# Patient Record
Sex: Male | Born: 1950 | Race: White | Hispanic: No | Marital: Single | Smoking: Never smoker
Health system: Southern US, Community
[De-identification: ages and names within clinical notes are randomized; demographics above are authoritative.]

## PROBLEM LIST (undated history)

## (undated) ENCOUNTER — Emergency Department (HOSPITAL_BASED_OUTPATIENT_CLINIC_OR_DEPARTMENT_OTHER): Payer: Medicare HMO

## (undated) DIAGNOSIS — F32A Depression, unspecified: Secondary | ICD-10-CM

## (undated) DIAGNOSIS — K219 Gastro-esophageal reflux disease without esophagitis: Secondary | ICD-10-CM

## (undated) DIAGNOSIS — E039 Hypothyroidism, unspecified: Secondary | ICD-10-CM

## (undated) DIAGNOSIS — I10 Essential (primary) hypertension: Secondary | ICD-10-CM

## (undated) HISTORY — PX: URETHRA SURGERY: SHX824

## (undated) HISTORY — PX: EXTERNAL EAR SURGERY: SHX627

---

## 2021-06-15 ENCOUNTER — Emergency Department (HOSPITAL_COMMUNITY): Payer: Medicare HMO

## 2021-06-15 ENCOUNTER — Encounter (HOSPITAL_COMMUNITY): Payer: Self-pay | Admitting: Emergency Medicine

## 2021-06-15 ENCOUNTER — Emergency Department (HOSPITAL_COMMUNITY)
Admission: EM | Admit: 2021-06-15 | Discharge: 2021-06-16 | Disposition: A | Discharge status: Home / Self Care | Payer: Medicare HMO | Source: Home / Self Care | Attending: Emergency Medicine | Admitting: Emergency Medicine

## 2021-06-15 ENCOUNTER — Other Ambulatory Visit: Payer: Self-pay

## 2021-06-15 DIAGNOSIS — Z23 Encounter for immunization: Secondary | ICD-10-CM | POA: Insufficient documentation

## 2021-06-15 DIAGNOSIS — S62635A Displaced fracture of distal phalanx of left ring finger, initial encounter for closed fracture: Secondary | ICD-10-CM | POA: Diagnosis not present

## 2021-06-15 DIAGNOSIS — S68625A Partial traumatic transphalangeal amputation of left ring finger, initial encounter: Secondary | ICD-10-CM

## 2021-06-15 DIAGNOSIS — Y9389 Activity, other specified: Secondary | ICD-10-CM | POA: Insufficient documentation

## 2021-06-15 DIAGNOSIS — Z79890 Hormone replacement therapy (postmenopausal): Secondary | ICD-10-CM | POA: Diagnosis not present

## 2021-06-15 DIAGNOSIS — W231XXA Caught, crushed, jammed, or pinched between stationary objects, initial encounter: Secondary | ICD-10-CM | POA: Insufficient documentation

## 2021-06-15 DIAGNOSIS — Z888 Allergy status to other drugs, medicaments and biological substances status: Secondary | ICD-10-CM | POA: Diagnosis not present

## 2021-06-15 DIAGNOSIS — W230XXA Caught, crushed, jammed, or pinched between moving objects, initial encounter: Secondary | ICD-10-CM | POA: Diagnosis not present

## 2021-06-15 DIAGNOSIS — Z79899 Other long term (current) drug therapy: Secondary | ICD-10-CM | POA: Diagnosis not present

## 2021-06-15 DIAGNOSIS — Z20822 Contact with and (suspected) exposure to covid-19: Secondary | ICD-10-CM | POA: Insufficient documentation

## 2021-06-15 DIAGNOSIS — I10 Essential (primary) hypertension: Secondary | ICD-10-CM | POA: Insufficient documentation

## 2021-06-15 HISTORY — DX: Essential (primary) hypertension: I10

## 2021-06-15 LAB — RESP PANEL BY RT-PCR (FLU A&B, COVID) ARPGX2
Influenza A by PCR: NEGATIVE
Influenza B by PCR: NEGATIVE
SARS Coronavirus 2 by RT PCR: NEGATIVE

## 2021-06-15 MED ORDER — CEFAZOLIN SODIUM-DEXTROSE 1-4 GM/50ML-% IV SOLN
1.0000 g | Freq: Once | INTRAVENOUS | Status: AC
  Administered 2021-06-16: 1 g via INTRAVENOUS
  Filled 2021-06-15: qty 50

## 2021-06-15 MED ORDER — TETANUS-DIPHTH-ACELL PERTUSSIS 5-2.5-18.5 LF-MCG/0.5 IM SUSY
0.5000 mL | PREFILLED_SYRINGE | Freq: Once | INTRAMUSCULAR | Status: AC
  Administered 2021-06-16: 0.5 mL via INTRAMUSCULAR
  Filled 2021-06-15: qty 0.5

## 2021-06-15 NOTE — ED Provider Notes (Signed)
[facility] EMERGENCY DEPARTMENT Provider Note  CSN: [id number] Arrival date & time: 06/15/21 1524  Chief Complaint(s) Finger Injury  HPI Thomas Hartman is a 70 y.o. male who sustained a crush injury to the left ring finger after getting it caught in a dry belt machinery.  This occurred around 1 PM.  Patient denies any severe pain at this time, but does have some mild discomfort.  Worse with palpation.  Alleviated by mobility. reports that he did not irrigate it.  He wrapped it up and came in for evaluation.  Unsure of tetanus update.  Denies any other injuries related to the incident.  Denies any other physical complaints.  HPI  Past Medical History Past Medical History:  Diagnosis Date   Hypertension    There are no problems to display for this patient.  Home Medication(s) Prior to Admission medications   Medication Sig Start Date End Date Taking? Authorizing Provider  allopurinol (ZYLOPRIM) 300 MG tablet Take 300 mg by mouth daily. 06/11/21  Yes [provider]  amLODipine (NORVASC) 5 MG tablet Take 5 mg by mouth daily. 06/11/21  Yes [provider]  atorvastatin (LIPITOR) 10 MG tablet Take 10 mg by mouth daily. 06/11/21  Yes [provider]  Cholecalciferol (VITAMIN D3 PO) Take 1 tablet by mouth daily.   Yes [provider]  Cyanocobalamin (VITAMIN B-12 PO) Take 1 tablet by mouth daily.   Yes [provider]  diphenhydrAMINE (SOMINEX) 25 MG tablet Take 25 mg by mouth daily as needed for allergies or itching.   Yes [provider]  FLUoxetine (PROZAC) 40 MG capsule Take 40 mg by mouth daily. 03/04/21  Yes [provider]  fluticasone (FLONASE) 50 MCG/ACT nasal spray Place 2 sprays into both nostrils daily. 03/22/16  Yes [provider]  ibuprofen (ADVIL) 200 MG tablet Take 400-800 mg by mouth every 6 (six) hours as needed for headache.   Yes [provider]  levothyroxine (SYNTHROID) 50 MCG  tablet Take 50 mcg by mouth daily before breakfast. 09/25/20  Yes [provider]  loratadine (CLARITIN) 10 MG tablet Take 10 mg by mouth daily.   Yes [provider]  Melatonin 10 MG TABS Take 10 mg by mouth at bedtime.   Yes [provider]  metoprolol succinate (TOPROL-XL) 50 MG 24 hr tablet Take 25 mg by mouth daily. 05/16/21  Yes [provider]  Multiple Vitamin (MULTIVITAMIN) tablet Take 1 tablet by mouth daily.   Yes [provider]  naproxen sodium (ALEVE) 220 MG tablet Take 220 mg by mouth daily as needed (pain).   Yes [provider]  omeprazole (PRILOSEC) 20 MG capsule Take 20 mg by mouth daily. 03/06/21  Yes [provider]  Past Surgical History Past Surgical History:  Procedure Laterality Date   EXTERNAL EAR SURGERY     URETHRA SURGERY     Family History No family history on file.  Social History Social History   Tobacco Use   Smoking status: Never   Smokeless tobacco: Never  Substance Use Topics   Alcohol use: Never   Drug use: Never   Allergies Zolpidem  Review of Systems Review of Systems All other systems are reviewed and are negative for acute change except as noted in the HPI  Physical Exam Vital Signs  I have reviewed the triage vital signs BP (!) 135/93 (BP Location: Right Arm)   Pulse (!) 54   Temp 98.4 F (36.9 C) (Oral)   Resp 16   Ht 5\' 10"  (1.778 m)   Wt 81.6 kg   SpO2 97%   BMI 25.83 kg/m   Physical Exam Vitals reviewed.  Constitutional:      General: He is not in acute distress.    Appearance: He is well-developed. He is not diaphoretic.  HENT:     Head: Normocephalic and atraumatic.     Right Ear: External ear normal.     Left Ear: External ear normal.     Nose: Nose normal.     Mouth/Throat:     Mouth: Mucous membranes are moist.  Eyes:      General: No scleral icterus.    Conjunctiva/sclera: Conjunctivae normal.  Neck:     Trachea: Phonation normal.  Cardiovascular:     Rate and Rhythm: Normal rate and regular rhythm.  Pulmonary:     Effort: Pulmonary effort is normal. No respiratory distress.     Breath sounds: No stridor.  Abdominal:     General: There is no distension.  Musculoskeletal:        General: Normal range of motion.       Hands:     Cervical back: Normal range of motion.  Neurological:     Mental Status: He is alert and oriented to person, place, and time.  Psychiatric:        Behavior: Behavior normal.         ED Results and Treatments Labs (all labs ordered are listed, but only abnormal results are displayed) Labs Reviewed  CBC WITH DIFFERENTIAL/PLATELET - Abnormal; Notable for the following components:      Result Value   Platelets 145 (*)    All other components within normal limits  BASIC METABOLIC PANEL - Abnormal; Notable for the following components:   Glucose, Bld 112 (*)    BUN 27 (*)    Creatinine, Ser 1.35 (*)    GFR, Estimated 56 (*)    All other components within normal limits  RESP PANEL BY RT-PCR (FLU A&B, COVID) ARPGX2                                                                                                                         EKG  EKG Interpretation  Date/Time:    Ventricular Rate:    PR Interval:    QRS Duration:   QT Interval:    QTC Calculation:   R Axis:     Text Interpretation:          Radiology DG Finger Ring Left  Result Date: 06/15/2021 CLINICAL DATA:  Laceration, digit was caught in moving machinery. EXAM: LEFT RING FINGER 2+V COMPARISON:  None. FINDINGS: Displaced fracture through the distal tuft. There is also a fracture involving the ulnar aspect at the base of the distal phalanx at the distal interphalangeal joint articular surface. Skin laceration and irregularity. Overlying dressing in place without definite radiopaque foreign body.  IMPRESSION: 1. Displaced distal phalanx fracture. Additionally there is a fracture of the ulnar aspect at the base of the distal phalanx at the distal interphalangeal joint articular surface. 2. Overlying soft tissue irregularity and laceration. Electronically Signed   By: Narda Rutherford M.D.   On: 06/15/2021 17:45    Pertinent labs & imaging results that were available during my care of the patient were reviewed by me and considered in my medical decision making (see chart for details).  Medications Ordered in ED Medications  acetaminophen (TYLENOL) tablet 1,000 mg (has no administration in time range)  Tdap (BOOSTRIX) injection 0.5 mL (0.5 mLs Intramuscular Given 06/16/21 0022)  ceFAZolin (ANCEF) IVPB 1 g/50 mL premix (0 g Intravenous Stopped 06/16/21 0038)  lidocaine (PF) (XYLOCAINE) 1 % injection 5 mL (5 mLs Intradermal Given by Other 06/16/21 0139)                                                                                                                                    Procedures .Marland KitchenLaceration Repair  Date/Time: 06/16/2021 1:39 AM Performed by: Nira Conn, MD Authorized by: Nira Conn, MD   Consent:    Consent obtained:  Verbal   Consent given by:  Patient   Risks discussed:  Infection   Alternatives discussed:  Delayed treatment Universal protocol:    Procedure explained and questions answered to patient or proxy's satisfaction: yes   Anesthesia:    Anesthesia method:  Local infiltration   Local anesthetic:  Lidocaine 1% w/o epi Laceration details:    Location:  Finger   Finger location:  L ring finger   Length (cm):  2.5   Depth (mm):  1 Pre-procedure details:    Preparation:  Patient was prepped and draped in usual sterile fashion and imaging obtained to evaluate for foreign bodies Exploration:    Hemostasis achieved with:  Direct pressure Treatment:    Area cleansed with:  Povidone-iodine   Amount of cleaning:  Extensive   Irrigation  solution:  Sterile saline   Irrigation volume:  2000cc   Irrigation method:  Pressure wash   Debridement:  None   Undermining:  None Skin repair:    Repair method:  Sutures   Suture size:  3-0   Wound skin closure material used: Ethilon.   Suture technique:  Simple interrupted   Number of sutures:  2 Approximation:    Approximation:  Loose Post-procedure details:    Dressing:  Non-adherent dressing, splint for protection and sterile dressing   Procedure completion:  Tolerated well, no immediate complications  (including critical care time)  Medical Decision Making / ED Course I have reviewed the nursing notes for this encounter and the patient's prior records (if available in EHR or on provided paperwork).   Thomas Hartman was evaluated in Emergency Department on 06/16/2021 for the symptoms described in the history of present illness. He was evaluated in the context of the global COVID-19 pandemic, which necessitated consideration that the patient might be at risk for infection with the SARS-CoV-2 virus that causes COVID-19. Institutional protocols and algorithms that pertain to the evaluation of patients at risk for COVID-19 are in a state of rapid change based on information released by regulatory bodies including the CDC and federal and state organizations. These policies and algorithms were followed during the patient's care in the ED.  Partial finger amputation. Tetanus updated. Plain film confirmed injury. Ancef given.  Consulted Dr. Melvyn Novasrtmann from hand surgery who requested that we tack the finger and place and have the patient return to the hospital at 11 AM for outpatient surgery.  He requested patient go to short stay.  Thoroughly irrigated and closed as above.      Final Clinical Impression(s) / ED Diagnoses Final diagnoses:  Partial traumatic amputation of left ring finger through phalanx, initial encounter    The patient appears reasonably screened and/or stabilized  for discharge and I doubt any other medical condition or other [facility] requiring further screening, evaluation, or treatment in the ED at this time prior to discharge. Safe for discharge with strict return precautions.  Disposition: Discharge  Condition: Good  I have discussed the results, Dx and Tx plan with the patient/family who expressed understanding and agree(s) with the plan. Discharge instructions discussed at length. The patient/family was given strict return precautions who verbalized understanding of the instructions. No further questions at time of discharge.    ED Discharge Orders     None        [facility] [address], [place], [place], [id number] [phone] Go to  Please arrive at 11 am and ask for Dr. Melvyn Novasrtmann. He will perform your surgery.     This chart was dictated using voice recognition software.  Despite best efforts to proofread,  errors can occur which can change the documentation meaning.    Nira Connardama, Matea Stanard Eduardo, MD 06/16/21 [phone]

## 2021-06-15 NOTE — ED Triage Notes (Signed)
Pt reports he was working on a piece of machinery when it got caught and cut the tip of his left ring finger almost all the way off. Bleeding controlled in triage. Pt denies pain. Unknown last tetanus.

## 2021-06-15 NOTE — ED Provider Notes (Signed)
Emergency Medicine Provider Triage Evaluation Note  Thomas Hartman , a 70 y.o. male  was evaluated in triage.  Pt complains of laceration to the left ring finger.   He is handed. This occurred shortly prior to arrival when he got his finger in a hay spreader  He denies any other injuries.  Unknown last tdap.  Reports his finger is "almost cut off."  Review of Systems  Positive: Finger injury, bleeding Negative: Headache, weakness.   Physical Exam  BP 140/88 (BP Location: Right Arm)   Pulse 62   Temp 98.4 F (36.9 C) (Oral)   Resp 16   Ht 5\' 10"  (1.778 m)   Wt 81.6 kg   SpO2 98%   BMI 25.83 kg/m  Gen:   Awake, no distress   Resp:  Normal effort  MSK:   Moves extremities without difficulty except left ring finger Other:  Left ring finger has multiple deep lacerations including damage to nail structures.  No active bleeding.  Wound is redressed  Medical Decision Making  Medically screening exam initiated at 5:02 PM.  Appropriate orders placed.  Thomas Hartman was informed that the remainder of the evaluation will be completed by another provider, this initial triage assessment does not replace that evaluation, and the importance of remaining in the ED until their evaluation is complete.  2-hour COVID test is ordered in case patient requires operative debridement tonight.  X-ray and tetanus are ordered.  Patient is aware he needs to remain n.p.o.   Natasha Mead, PA-C 06/15/21 1704    06/17/21, MD 06/15/21 [phone]

## 2021-06-16 ENCOUNTER — Encounter (HOSPITAL_COMMUNITY)
Admission: RE | Disposition: A | Discharge status: Home / Self Care | Payer: Self-pay | Source: Home / Self Care | Attending: Orthopedic Surgery

## 2021-06-16 ENCOUNTER — Other Ambulatory Visit: Payer: Self-pay

## 2021-06-16 ENCOUNTER — Ambulatory Visit (HOSPITAL_COMMUNITY): Payer: Medicare HMO

## 2021-06-16 ENCOUNTER — Ambulatory Visit (HOSPITAL_COMMUNITY): Payer: Medicare HMO | Admitting: Anesthesiology

## 2021-06-16 ENCOUNTER — Ambulatory Visit (HOSPITAL_COMMUNITY)
Admission: RE | Admit: 2021-06-16 | Discharge: 2021-06-16 | Disposition: A | Discharge status: Home / Self Care | Payer: Medicare HMO | Attending: Orthopedic Surgery | Admitting: Orthopedic Surgery

## 2021-06-16 ENCOUNTER — Encounter (HOSPITAL_COMMUNITY): Payer: Self-pay | Admitting: Orthopedic Surgery

## 2021-06-16 DIAGNOSIS — Z23 Encounter for immunization: Secondary | ICD-10-CM | POA: Diagnosis not present

## 2021-06-16 DIAGNOSIS — Z888 Allergy status to other drugs, medicaments and biological substances status: Secondary | ICD-10-CM | POA: Diagnosis not present

## 2021-06-16 DIAGNOSIS — Z20822 Contact with and (suspected) exposure to covid-19: Secondary | ICD-10-CM | POA: Diagnosis not present

## 2021-06-16 DIAGNOSIS — W230XXA Caught, crushed, jammed, or pinched between moving objects, initial encounter: Secondary | ICD-10-CM | POA: Insufficient documentation

## 2021-06-16 DIAGNOSIS — S62635A Displaced fracture of distal phalanx of left ring finger, initial encounter for closed fracture: Secondary | ICD-10-CM | POA: Insufficient documentation

## 2021-06-16 DIAGNOSIS — Z79899 Other long term (current) drug therapy: Secondary | ICD-10-CM | POA: Insufficient documentation

## 2021-06-16 DIAGNOSIS — Z79890 Hormone replacement therapy (postmenopausal): Secondary | ICD-10-CM | POA: Insufficient documentation

## 2021-06-16 HISTORY — DX: Depression, unspecified: F32.A

## 2021-06-16 HISTORY — PX: I & D EXTREMITY: SHX5045

## 2021-06-16 HISTORY — DX: Hypothyroidism, unspecified: E03.9

## 2021-06-16 HISTORY — DX: Gastro-esophageal reflux disease without esophagitis: K21.9

## 2021-06-16 LAB — CBC WITH DIFFERENTIAL/PLATELET
Abs Immature Granulocytes: 0.04 10*3/uL (ref 0.00–0.07)
Basophils Absolute: 0.1 10*3/uL (ref 0.0–0.1)
Basophils Relative: 1 %
Eosinophils Absolute: 0.2 10*3/uL (ref 0.0–0.5)
Eosinophils Relative: 2 %
HCT: 47.4 % (ref 39.0–52.0)
Hemoglobin: 16 g/dL (ref 13.0–17.0)
Immature Granulocytes: 1 %
Lymphocytes Relative: 25 %
Lymphs Abs: 2.1 10*3/uL (ref 0.7–4.0)
MCH: 30.2 pg (ref 26.0–34.0)
MCHC: 33.8 g/dL (ref 30.0–36.0)
MCV: 89.4 fL (ref 80.0–100.0)
Monocytes Absolute: 1 10*3/uL (ref 0.1–1.0)
Monocytes Relative: 12 %
Neutro Abs: 5.1 10*3/uL (ref 1.7–7.7)
Neutrophils Relative %: 59 %
Platelets: 145 10*3/uL — ABNORMAL LOW (ref 150–400)
RBC: 5.3 MIL/uL (ref 4.22–5.81)
RDW: 13.3 % (ref 11.5–15.5)
WBC: 8.5 10*3/uL (ref 4.0–10.5)
nRBC: 0 % (ref 0.0–0.2)

## 2021-06-16 LAB — BASIC METABOLIC PANEL
Anion gap: 12 (ref 5–15)
BUN: 27 mg/dL — ABNORMAL HIGH (ref 8–23)
CO2: 28 mmol/L (ref 22–32)
Calcium: 10.2 mg/dL (ref 8.9–10.3)
Chloride: 102 mmol/L (ref 98–111)
Creatinine, Ser: 1.35 mg/dL — ABNORMAL HIGH (ref 0.61–1.24)
GFR, Estimated: 56 mL/min — ABNORMAL LOW (ref >60)
Glucose, Bld: 112 mg/dL — ABNORMAL HIGH (ref 70–99)
Potassium: 4.4 mmol/L (ref 3.5–5.1)
Sodium: 142 mmol/L (ref 135–145)

## 2021-06-16 SURGERY — IRRIGATION AND DEBRIDEMENT EXTREMITY
Anesthesia: General | Laterality: Left

## 2021-06-16 MED ORDER — CHLORHEXIDINE GLUCONATE 0.12 % MT SOLN
15.0000 mL | Freq: Once | OROMUCOSAL | Status: AC
  Administered 2021-06-16: 15 mL via OROMUCOSAL

## 2021-06-16 MED ORDER — LACTATED RINGERS IV SOLN: INTRAVENOUS | Status: DC | PRN

## 2021-06-16 MED ORDER — MIDAZOLAM HCL 2 MG/2ML IJ SOLN
INTRAMUSCULAR | Status: AC
  Filled 2021-06-16: qty 2

## 2021-06-16 MED ORDER — MIDAZOLAM HCL 2 MG/2ML IJ SOLN
INTRAMUSCULAR | Status: DC | PRN
  Administered 2021-06-16: 2 mg via INTRAVENOUS

## 2021-06-16 MED ORDER — LIDOCAINE HCL (PF) 1 % IJ SOLN
INTRAMUSCULAR | Status: DC | PRN
  Administered 2021-06-16: 5 mL

## 2021-06-16 MED ORDER — METOPROLOL SUCCINATE ER 25 MG PO TB24
25.0000 mg | ORAL_TABLET | Freq: Once | ORAL | Status: AC
  Administered 2021-06-16: 25 mg via ORAL

## 2021-06-16 MED ORDER — CEPHALEXIN 500 MG PO CAPS: 500.0000 mg | ORAL_CAPSULE | Freq: Four times a day (QID) | ORAL | 0 refills | Status: AC

## 2021-06-16 MED ORDER — LIDOCAINE HCL (PF) 1 % IJ SOLN
5.0000 mL | Freq: Once | INTRAMUSCULAR | Status: AC
  Administered 2021-06-16: 5 mL via INTRADERMAL
  Filled 2021-06-16: qty 5

## 2021-06-16 MED ORDER — ACETAMINOPHEN 500 MG PO TABS
1000.0000 mg | ORAL_TABLET | Freq: Once | ORAL | Status: AC
  Administered 2021-06-16: 1000 mg via ORAL
  Filled 2021-06-16: qty 2

## 2021-06-16 MED ORDER — LIDOCAINE HCL (PF) 1 % IJ SOLN
INTRAMUSCULAR | Status: AC
  Filled 2021-06-16: qty 5

## 2021-06-16 MED ORDER — BUPIVACAINE HCL (PF) 0.25 % IJ SOLN
INTRAMUSCULAR | Status: AC
  Filled 2021-06-16: qty 10

## 2021-06-16 MED ORDER — BUPIVACAINE HCL (PF) 0.25 % IJ SOLN
INTRAMUSCULAR | Status: DC | PRN
  Administered 2021-06-16: 5 mg

## 2021-06-16 MED ORDER — HYDROCODONE-ACETAMINOPHEN 5-325 MG PO TABS: 1.0000 | ORAL_TABLET | Freq: Four times a day (QID) | ORAL | 0 refills | Status: AC | PRN

## 2021-06-16 MED ORDER — CEFAZOLIN SODIUM-DEXTROSE 2-4 GM/100ML-% IV SOLN
2.0000 g | INTRAVENOUS | Status: AC
  Administered 2021-06-16: 2 g via INTRAVENOUS

## 2021-06-16 MED ORDER — LACTATED RINGERS IV SOLN: INTRAVENOUS | Status: DC

## 2021-06-16 SURGICAL SUPPLY — 58 items
BNDG COHESIVE 1X5 TAN STRL LF (GAUZE/BANDAGES/DRESSINGS) ×3 IMPLANT
BNDG CONFORM 2 STRL LF (GAUZE/BANDAGES/DRESSINGS) IMPLANT
BNDG ELASTIC 3X5.8 VLCR STR LF (GAUZE/BANDAGES/DRESSINGS) ×3 IMPLANT
BNDG ELASTIC 4X5.8 VLCR STR LF (GAUZE/BANDAGES/DRESSINGS) ×3 IMPLANT
BNDG ESMARK 4X9 LF (GAUZE/BANDAGES/DRESSINGS) ×3 IMPLANT
BNDG GAUZE ELAST 4 BULKY (GAUZE/BANDAGES/DRESSINGS) ×3 IMPLANT
CORD BIPOLAR FORCEPS 12FT (ELECTRODE) ×3 IMPLANT
COVER SURGICAL LIGHT HANDLE (MISCELLANEOUS) ×3 IMPLANT
COVER WAND RF STERILE (DRAPES) ×3 IMPLANT
CUFF TOURN SGL QUICK 18X4 (TOURNIQUET CUFF) ×3 IMPLANT
CUFF TOURN SGL QUICK 24 (TOURNIQUET CUFF)
CUFF TRNQT CYL 24X4X16.5-23 (TOURNIQUET CUFF) IMPLANT
DRAIN PENROSE 1/4X12 LTX STRL (WOUND CARE) IMPLANT
DRAPE SURG 17X23 STRL (DRAPES) ×3 IMPLANT
DRSG ADAPTIC 3X8 NADH LF (GAUZE/BANDAGES/DRESSINGS) ×6 IMPLANT
DRSG XEROFORM 1X8 (GAUZE/BANDAGES/DRESSINGS) ×3 IMPLANT
ELECT REM PT RETURN 9FT ADLT (ELECTROSURGICAL)
ELECTRODE REM PT RTRN 9FT ADLT (ELECTROSURGICAL) IMPLANT
GAUZE SPONGE 4X4 12PLY STRL (GAUZE/BANDAGES/DRESSINGS) ×3 IMPLANT
GAUZE XEROFORM 1X8 LF (GAUZE/BANDAGES/DRESSINGS) ×3 IMPLANT
GAUZE XEROFORM 5X9 LF (GAUZE/BANDAGES/DRESSINGS) IMPLANT
GLOVE BIOGEL PI IND STRL 8.5 (GLOVE) ×1 IMPLANT
GLOVE BIOGEL PI INDICATOR 8.5 (GLOVE) ×2
GLOVE SURG ORTHO 8.0 STRL STRW (GLOVE) ×3 IMPLANT
GOWN STRL REUS W/ TWL LRG LVL3 (GOWN DISPOSABLE) ×3 IMPLANT
GOWN STRL REUS W/ TWL XL LVL3 (GOWN DISPOSABLE) ×1 IMPLANT
GOWN STRL REUS W/TWL LRG LVL3 (GOWN DISPOSABLE) ×6
GOWN STRL REUS W/TWL XL LVL3 (GOWN DISPOSABLE) ×2
HANDPIECE INTERPULSE COAX TIP (DISPOSABLE)
KIT BASIN OR (CUSTOM PROCEDURE TRAY) ×3 IMPLANT
KIT TURNOVER KIT B (KITS) ×3 IMPLANT
MANIFOLD NEPTUNE II (INSTRUMENTS) ×3 IMPLANT
NEEDLE HYPO 25GX1X1/2 BEV (NEEDLE) IMPLANT
NS IRRIG 1000ML POUR BTL (IV SOLUTION) ×3 IMPLANT
PACK ORTHO EXTREMITY (CUSTOM PROCEDURE TRAY) ×3 IMPLANT
PAD ARMBOARD 7.5X6 YLW CONV (MISCELLANEOUS) ×6 IMPLANT
PAD CAST 4YDX4 CTTN HI CHSV (CAST SUPPLIES) ×1 IMPLANT
PADDING CAST COTTON 4X4 STRL (CAST SUPPLIES) ×2
SET CYSTO W/LG BORE CLAMP LF (SET/KITS/TRAYS/PACK) IMPLANT
SET HNDPC FAN SPRY TIP SCT (DISPOSABLE) IMPLANT
SOAP 2 % CHG 4 OZ (WOUND CARE) ×3 IMPLANT
SPLINT FINGER 4.25 911904 (SOFTGOODS) ×3 IMPLANT
SPONGE LAP 18X18 RF (DISPOSABLE) ×3 IMPLANT
SPONGE LAP 4X18 RFD (DISPOSABLE) ×3 IMPLANT
SUT ETHILON 4 0 PS 2 18 (SUTURE) IMPLANT
SUT ETHILON 5 0 P 3 18 (SUTURE)
SUT NYLON ETHILON 5-0 P-3 1X18 (SUTURE) IMPLANT
SUT PROLENE 4 0 PS 2 18 (SUTURE) ×3 IMPLANT
SWAB COLLECTION DEVICE MRSA (MISCELLANEOUS) ×3 IMPLANT
SWAB CULTURE ESWAB REG 1ML (MISCELLANEOUS) IMPLANT
SYR CONTROL 10ML LL (SYRINGE) IMPLANT
TOWEL GREEN STERILE (TOWEL DISPOSABLE) ×3 IMPLANT
TOWEL GREEN STERILE FF (TOWEL DISPOSABLE) ×3 IMPLANT
TUBE CONNECTING 12'X1/4 (SUCTIONS) ×1
TUBE CONNECTING 12X1/4 (SUCTIONS) ×2 IMPLANT
UNDERPAD 30X36 HEAVY ABSORB (UNDERPADS AND DIAPERS) ×3 IMPLANT
WATER STERILE IRR 1000ML POUR (IV SOLUTION) ×3 IMPLANT
YANKAUER SUCT BULB TIP NO VENT (SUCTIONS) ×3 IMPLANT

## 2021-06-16 NOTE — Progress Notes (Signed)
Notified Dr. Noreene Larsson of pt. Eating a egg/sausage biscuit this am at 1000.

## 2021-06-16 NOTE — Anesthesia Postprocedure Evaluation (Signed)
Anesthesia Post Note  Patient: Thomas Hartman  Procedure(s) Performed: IRRIGATION AND DEBRIDEMENT RING FINGER (Left)     Patient location during evaluation: PACU Anesthesia Type: MAC Level of consciousness: awake and alert Pain management: pain level controlled Vital Signs Assessment: post-procedure vital signs reviewed and stable Respiratory status: spontaneous breathing, nonlabored ventilation, respiratory function stable and patient connected to nasal cannula oxygen Cardiovascular status: stable and blood pressure returned to baseline Postop Assessment: no apparent nausea or vomiting Anesthetic complications: no   No notable events documented.  Last Vitals:  Vitals:   06/16/21 1541 06/16/21 1555  BP: 118/65 112/64  Pulse: (!) 53 (!) 54  Resp: 18 16  Temp:  36.8 C  SpO2: 94% 98%    Last Pain:  Vitals:   06/16/21 1555  TempSrc:   PainSc: 0-No pain                 Uniqua Kihn COKER

## 2021-06-16 NOTE — Anesthesia Preprocedure Evaluation (Signed)
Anesthesia Evaluation  Patient identified by MRN, date of birth, ID band Patient awake    Reviewed: Allergy & Precautions, NPO status , Patient's Chart, lab work & pertinent test results  Airway Mallampati: II  TM Distance: >3 FB Neck ROM: Full    Dental   Pulmonary    breath sounds clear to auscultation       Cardiovascular hypertension,  Rhythm:Regular Rate:Normal     Neuro/Psych    GI/Hepatic   Endo/Other    Renal/GU      Musculoskeletal   Abdominal   Peds  Hematology   Anesthesia Other Findings   Reproductive/Obstetrics                             Anesthesia Physical Anesthesia Plan  ASA: 3  Anesthesia Plan: MAC   Post-op Pain Management:    Induction: Intravenous  PONV Risk Score and Plan: Ondansetron  Airway Management Planned: Natural Airway and Simple Face Mask  Additional Equipment:   Intra-op Plan:   Post-operative Plan:   Informed Consent: I have reviewed the patients History and Physical, chart, labs and discussed the procedure including the risks, benefits and alternatives for the proposed anesthesia with the patient or authorized representative who has indicated his/her understanding and acceptance.       Plan Discussed with: CRNA and Anesthesiologist  Anesthesia Plan Comments:         Anesthesia Quick Evaluation

## 2021-06-16 NOTE — Discharge Instructions (Signed)
KEEP BANDAGE CLEAN AND DRY CALL OFFICE FOR F/U APPT [phone] in 10 days KEEP HAND ELEVATED ABOVE HEART OK TO APPLY ICE TO OPERATIVE AREA CONTACT OFFICE IF ANY WORSENING PAIN OR CONCERNS.  

## 2021-06-16 NOTE — Progress Notes (Signed)
Orthopedic Tech Progress Note Patient Details:  Thomas Hartman 1951-09-01 [id number]  Ortho Devices Type of Ortho Device: Finger splint Ortho Device/Splint Location: LUE Ortho Device/Splint Interventions: Ordered, Application, Adjustment   Post Interventions Patient Tolerated: Well Instructions Provided: Care of device, Poper ambulation with device  Thomas Hartman 06/16/2021, 2:00 AM

## 2021-06-16 NOTE — Transfer of Care (Signed)
Immediate Anesthesia Transfer of Care Note  Patient: Thomas Hartman  Procedure(s) Performed: IRRIGATION AND DEBRIDEMENT RING FINGER (Left)  Patient Location: PACU  Anesthesia Type:Regional  Level of Consciousness: awake, alert  and patient cooperative  Airway & Oxygen Therapy: Patient Spontanous Breathing  Post-op Assessment: Report given to RN and Post -op Vital signs reviewed and stable  Post vital signs: Reviewed and stable  Last Vitals:  Vitals Value Taken Time  BP 123/78 06/16/21 1526  Temp    Pulse 53 06/16/21 1527  Resp 17 06/16/21 1527  SpO2 97 % 06/16/21 1527  Vitals shown include unvalidated device data.  Last Pain:  Vitals:   06/16/21 1201  TempSrc: Oral  PainSc: 0-No pain      Patients Stated Pain Goal: 5 (06/16/21 1201)  Complications: No notable events documented.

## 2021-06-16 NOTE — Op Note (Signed)
PREOPERATIVE DIAGNOSIS: Comminuted left ring finger distal phalanx fracture with fracture of the distal phalanx and dislocation the distal interphalangeal joint  POSTOPERATIVE DIAGNOSIS: Same  ATTENDING SURGEON: Dr. Bradly Bienenstock who scrubbed and present for the entire procedure  ASSISTANT SURGEON: None  ANESTHESIA: 1% Xylocaine cortisone Marcaine local block with IV sedation  OPERATIVE PROCEDURE: Open treatment of left ring finger distal phalanx fracture requiring internal fixation Open treatment of left ring finger distal interphalangeal joint dislocation requiring internal fixation Left ring finger nailbed repair Open debridement of skin subcutaneous tissue and bone associated with open left ring finger distal phalanx fracture Radiographs 2 views left ring finger  IMPLANTS: One 0.045 K wire  EBL: Minimal  RADIOGRAPHIC INTERPRETATION: AP lateral views of the left ring finger do show the K wire across the distal interphalangeal joint with good reduction of the joint and alignment of the distal phalanx fracture  SURGICAL INDICATIONS: Patient is a right-hand-dominant gentleman sustained injury to his left ring finger.  Patient was seen and evaluated and recommended undergo the above procedure.  The risks of surgery include but not limited to bleeding infection damage nearby nerves arteries or tendons loss of motion of the wrist and digits incomplete relief of symptoms and need for further surgical invention.  SURGICAL TECHNIQUE: Patient was palpated by the preoperative holding area marked apart a marker made the left ring finger and indicate correct operative site.  Patient brought back operating placed supine on the anesthesia table where the local anesthetic was administered.  Patient tolerates well.  Preoperative antibiotics were given prior to skin incision.  Well-padded tourniquet placed on the left brachium and sealed with the appropriate drape.  Left upper extremities then prepped and  draped normal sterile fashion.  A timeout was called the correct site identified procedure then begun.  Attention then turned to the ring finger excisional debridement was then carried out of the ring finger. Debridement type: Excisional Debridement  Side: left  Body Location: :left ring finger    Tools used for debridement: scalpel, scissors, curette, and rongeur  Pre-debridement Wound size (cm):   Length: 3        Width: 2     Depth: 1   Post-debridement Wound size (cm):   Length: 3        Width:2     Depth: 1   Debridement depth beyond dead/damaged tissue down to healthy viable tissue: yes  Tissue layer involved: skin, subcutaneous tissue, muscle / fascia, bone  Nature of tissue removed: Devitalized Tissue  Irrigation volume: 500     Irrigation fluid type: Normal Saline   Patient tolerated excisional debridement.  Following this the nail plate had been removed.  Once was carried out the open fracture was then debrided with the sharp scissors curettes and rongeurs.  0.045 K wire was then placed across the distal phalanx fracture this was then placed all the way to the distal interphalangeal joint which was dislocated this was an open joint.  This was then placed across the distal interphalangeal joint under direct visualization.  Stabilize both the fracture and the distal interphalangeal joint dislocation.  Thorough wound irrigation done throughout.  Following this the nailbed was then repaired using chromic sutures.  The laceration was near circumferential was repaired with simple sutures.  The nail plate was then placed about beneath the eponychium.  Once this was done the tourniquet deflated.  The K wire was then cut and bent left out of the skin Xeroform bolster was then placed around  the K wire.  Adaptic dressing sterile compressive bandage then applied.  The patient was placed in a small finger splint taken recovery room in good condition.  POSTOPERATIVE PLAN: Patient be discharged  home.  See him back in the office in 7 to 10 days for pin check x-rays down to see our therapist for small tip protector splint.  We will see how the viability of the tip does.  The patient did have a significant soft tissue injury and if the fingertip does not survive the patient may require further intervention such as amputation through the distal interphalangeal joint.

## 2021-06-16 NOTE — H&P (Signed)
Thomas Hartman is an 70 y.o. male.   Chief Complaint: Left ring finger injury HPI: Thomas Hartman is a 70 y.o. male who sustained a crush injury to the left ring finger after getting it caught in a dry belt machinery.  This occurred around 1 PM on 6/17  Patient denies any severe pain at this time, but does have some mild discomfort.  Worse with palpation.  Alleviated by mobility. reports that he did not irrigate it.  He wrapped it up and came in for evaluation.  TD given in ED.  Denies any other injuries related to the incident.  Denies any other physical complaints.  Past Medical History:  Diagnosis Date   Depression    GERD (gastroesophageal reflux disease)    Hypertension    Hypothyroidism     Past Surgical History:  Procedure Laterality Date   EXTERNAL EAR SURGERY     URETHRA SURGERY      History reviewed. No pertinent family history. Social History:  reports that he has never smoked. He has never used smokeless tobacco. He reports that he does not drink alcohol and does not use drugs.  Allergies:  Allergies  Allergen Reactions   Zolpidem     Pt states entire family is allergic to Ambien Pt states entire family is allergic to Ambien     Medications Prior to Admission  Medication Sig Dispense Refill   allopurinol (ZYLOPRIM) 300 MG tablet Take 300 mg by mouth daily.     amLODipine (NORVASC) 5 MG tablet Take 5 mg by mouth daily.     atorvastatin (LIPITOR) 10 MG tablet Take 10 mg by mouth daily.     Cholecalciferol (VITAMIN D3 PO) Take 1 tablet by mouth daily.     Cyanocobalamin (VITAMIN B-12 PO) Take 1 tablet by mouth daily.     FLUoxetine (PROZAC) 40 MG capsule Take 40 mg by mouth daily.     fluticasone (FLONASE) 50 MCG/ACT nasal spray Place 2 sprays into both nostrils daily.     ibuprofen (ADVIL) 200 MG tablet Take 400-800 mg by mouth every 6 (six) hours as needed for headache.     levothyroxine (SYNTHROID) 50 MCG tablet Take 50 mcg by mouth daily before breakfast.      loratadine (CLARITIN) 10 MG tablet Take 10 mg by mouth daily.     Melatonin 10 MG TABS Take 10 mg by mouth at bedtime.     metoprolol succinate (TOPROL-XL) 50 MG 24 hr tablet Take 25 mg by mouth daily.     Multiple Vitamin (MULTIVITAMIN) tablet Take 1 tablet by mouth daily.     naproxen sodium (ALEVE) 220 MG tablet Take 220 mg by mouth daily as needed (pain).     omeprazole (PRILOSEC) 20 MG capsule Take 20 mg by mouth daily.     diphenhydrAMINE (SOMINEX) 25 MG tablet Take 25 mg by mouth daily as needed for allergies or itching.      Results for orders placed or performed during the hospital encounter of 06/15/21 (from the past 48 hour(s))  Resp Panel by RT-PCR (Flu A&B, Covid) Nasopharyngeal Swab     Status: None   Collection Time: 06/15/21  5:03 PM   Specimen: Nasopharyngeal Swab; Nasopharyngeal(NP) swabs in vial transport medium  Result Value Ref Range   SARS Coronavirus 2 by RT PCR NEGATIVE NEGATIVE    Comment: (NOTE) SARS-CoV-2 target nucleic acids are NOT DETECTED.  The SARS-CoV-2 RNA is generally detectable in upper respiratory specimens during the acute phase of infection.  The lowest concentration of SARS-CoV-2 viral copies this assay can detect is 138 copies/mL. A negative result does not preclude SARS-Cov-2 infection and should not be used as the sole basis for treatment or other patient management decisions. A negative result may occur with  improper specimen collection/handling, submission of specimen other than nasopharyngeal swab, presence of viral mutation(s) within the areas targeted by this assay, and inadequate number of viral copies(<138 copies/mL). A negative result must be combined with clinical observations, patient history, and epidemiological information. The expected result is Negative.  Fact Sheet for Patients:  BloggerCourse.comhttps://www.fda.gov/media/152166/download  Fact Sheet for Healthcare Providers:  SeriousBroker.ithttps://www.fda.gov/media/152162/download  This test is no t yet  approved or cleared by the [place] FDA and  has been authorized for detection and/or diagnosis of SARS-CoV-2 by FDA under an Emergency Use Authorization (EUA). This EUA will remain  in effect (meaning this test can be used) for the duration of the COVID-19 declaration under Section 564(b)(1) of the Act, 21 U.S.C.section 360bbb-3(b)(1), unless the authorization is terminated  or revoked sooner.       Influenza A by PCR NEGATIVE NEGATIVE   Influenza B by PCR NEGATIVE NEGATIVE    Comment: (NOTE) The Xpert Xpress SARS-CoV-2/FLU/RSV plus assay is intended as an aid in the diagnosis of influenza from Nasopharyngeal swab specimens and should not be used as a sole basis for treatment. Nasal washings and aspirates are unacceptable for Xpert Xpress SARS-CoV-2/FLU/RSV testing.  Fact Sheet for Patients: BloggerCourse.comhttps://www.fda.gov/media/152166/download  Fact Sheet for Healthcare Providers: SeriousBroker.ithttps://www.fda.gov/media/152162/download  This test is not yet approved or cleared by the [place] FDA and has been authorized for detection and/or diagnosis of SARS-CoV-2 by FDA under an Emergency Use Authorization (EUA). This EUA will remain in effect (meaning this test can be used) for the duration of the COVID-19 declaration under Section 564(b)(1) of the Act, 21 U.S.C. section 360bbb-3(b)(1), unless the authorization is terminated or revoked.  Performed at [facility], [address] [place], [place] [address]   CBC with Differential/Platelet     Status: Abnormal   Collection Time: 06/15/21 11:37 PM  Result Value Ref Range   WBC 8.5 4.0 - 10.5 K/uL   RBC 5.30 4.22 - 5.81 MIL/uL   Hemoglobin 16.0 13.0 - 17.0 g/dL   HCT 42.547.4 95.639.0 - 38.752.0 %   MCV 89.4 80.0 - 100.0 fL   MCH 30.2 26.0 - 34.0 pg   MCHC 33.8 30.0 - 36.0 g/dL   RDW 56.413.3 33.211.5 - 95.115.5 %   Platelets 145 (L) 150 - 400 K/uL   nRBC 0.0 0.0 - 0.2 %   Neutrophils Relative % 59 %   Neutro Abs 5.1 1.7 - 7.7 K/uL   Lymphocytes  Relative 25 %   Lymphs Abs 2.1 0.7 - 4.0 K/uL   Monocytes Relative 12 %   Monocytes Absolute 1.0 0.1 - 1.0 K/uL   Eosinophils Relative 2 %   Eosinophils Absolute 0.2 0.0 - 0.5 K/uL   Basophils Relative 1 %   Basophils Absolute 0.1 0.0 - 0.1 K/uL   Immature Granulocytes 1 %   Abs Immature Granulocytes 0.04 0.00 - 0.07 K/uL    Comment: Performed at [facility], [address] [place], [place] [address]  Basic metabolic panel     Status: Abnormal   Collection Time: 06/15/21 11:37 PM  Result Value Ref Range   Sodium 142 135 - 145 mmol/L   Potassium 4.4 3.5 - 5.1 mmol/L   Chloride 102 98 -  111 mmol/L   CO2 28 22 - 32 mmol/L   Glucose, Bld 112 (H) 70 - 99 mg/dL    Comment: Glucose reference range applies only to samples taken after fasting for at least 8 hours.   BUN 27 (H) 8 - 23 mg/dL   Creatinine, Ser 5.91 (H) 0.61 - 1.24 mg/dL   Calcium 63.8 8.9 - 46.6 mg/dL   GFR, Estimated 56 (L) >60 mL/min    Comment: (NOTE) Calculated using the CKD-EPI Creatinine Equation (2021)    Anion gap 12 5 - 15    Comment: Performed at [facility], [address] [place], [place] [address]   DG Finger Ring Left  Result Date: 06/15/2021 CLINICAL DATA:  Laceration, digit was caught in moving machinery. EXAM: LEFT RING FINGER 2+V COMPARISON:  None. FINDINGS: Displaced fracture through the distal tuft. There is also a fracture involving the ulnar aspect at the base of the distal phalanx at the distal interphalangeal joint articular surface. Skin laceration and irregularity. Overlying dressing in place without definite radiopaque foreign body. IMPRESSION: 1. Displaced distal phalanx fracture. Additionally there is a fracture of the ulnar aspect at the base of the distal phalanx at the distal interphalangeal joint articular surface. 2. Overlying soft tissue irregularity and laceration. Electronically Signed   By: Narda Rutherford M.D.   On: 06/15/2021 17:45    ROS no cardiac pulmonary  gastrointestinal GI or GU or neurological complaints.  Blood pressure 130/86, pulse 64, temperature 98.1 F (36.7 C), temperature source Oral, resp. rate 18, SpO2 94 %. Physical Exam  General Appearance:  Alert, cooperative, no distress, appears stated age  Head:  Normocephalic, without obvious abnormality, atraumatic  Eyes:  Pupils equal, conjunctiva/corneas clear,         Throat: Lips, mucosa, and tongue normal; teeth and gums normal  Neck: No visible masses     Lungs:   respirations unlabored  Chest Wall:  No tenderness or deformity  Heart:  Regular rate and rhythm,  Abdomen:   Soft, non-tender,         Extremities: Patient does have the finger splint to the left ring finger in place.  Good mobility to the index long and small fingers with thumb mobility  Pulses: 2+ and symmetric  Skin: Skin color, texture, turgor normal, no rashes or lesions     Neurologic: Normal  R  Assessment/Plan Left ring finger open distal phalanx fracture with near amputation.  The plan today would be for the ring finger to be debrided stabilization of the fracture potential reconstruction and/or revision amputation.  We talked about the risks of surgery to include but not limited to bleeding infection damage nearby nerves arteries or tendons loss of motion of the wrist and digits persistent pain and phantom pain and need for further surgical invention.  Patient voiced understand the reason the rationale for the intervention today.  R/B/A DISCUSSED WITH PT IN OFFICE.  PT VOICED UNDERSTANDING OF PLAN CONSENT SIGNED DAY OF SURGERY PT SEEN AND EXAMINED PRIOR TO OPERATIVE PROCEDURE/DAY OF SURGERY SITE MARKED. QUESTIONS ANSWERED WILL GO HOME FOLLOWING SURGERY   WE ARE PLANNING SURGERY FOR YOUR UPPER EXTREMITY. THE RISKS AND BENEFITS OF SURGERY INCLUDE BUT NOT LIMITED TO BLEEDING INFECTION, DAMAGE TO NEARBY NERVES ARTERIES TENDONS, FAILURE OF SURGERY TO ACCOMPLISH ITS INTENDED GOALS, PERSISTENT SYMPTOMS  AND NEED FOR FURTHER SURGICAL INTERVENTION. WITH THIS IN MIND WE WILL PROCEED. I HAVE DISCUSSED WITH THE PATIENT THE PRE AND POSTOPERATIVE REGIMEN AND THE DOS AND  DON'TS. PT VOICED UNDERSTANDING AND INFORMED CONSENT SIGNED.   Thomasene Ripple [facility] 06/16/2021, 2:00 PM

## 2021-06-17 ENCOUNTER — Encounter (HOSPITAL_COMMUNITY): Payer: Self-pay | Admitting: Orthopedic Surgery

## 2022-09-23 ENCOUNTER — Emergency Department (HOSPITAL_BASED_OUTPATIENT_CLINIC_OR_DEPARTMENT_OTHER): Payer: Medicare HMO

## 2022-09-23 ENCOUNTER — Emergency Department (HOSPITAL_BASED_OUTPATIENT_CLINIC_OR_DEPARTMENT_OTHER)
Admission: EM | Admit: 2022-09-23 | Discharge: 2022-09-23 | Disposition: A | Discharge status: Home / Self Care | Payer: Medicare HMO | Attending: Emergency Medicine | Admitting: Emergency Medicine

## 2022-09-23 ENCOUNTER — Other Ambulatory Visit: Payer: Self-pay

## 2022-09-23 ENCOUNTER — Encounter (HOSPITAL_BASED_OUTPATIENT_CLINIC_OR_DEPARTMENT_OTHER): Payer: Self-pay

## 2022-09-23 DIAGNOSIS — I10 Essential (primary) hypertension: Secondary | ICD-10-CM | POA: Insufficient documentation

## 2022-09-23 DIAGNOSIS — Z79899 Other long term (current) drug therapy: Secondary | ICD-10-CM | POA: Diagnosis not present

## 2022-09-23 DIAGNOSIS — Z20822 Contact with and (suspected) exposure to covid-19: Secondary | ICD-10-CM | POA: Diagnosis not present

## 2022-09-23 DIAGNOSIS — R519 Headache, unspecified: Secondary | ICD-10-CM | POA: Diagnosis not present

## 2022-09-23 DIAGNOSIS — M542 Cervicalgia: Secondary | ICD-10-CM | POA: Insufficient documentation

## 2022-09-23 DIAGNOSIS — R0981 Nasal congestion: Secondary | ICD-10-CM | POA: Insufficient documentation

## 2022-09-23 DIAGNOSIS — R319 Hematuria, unspecified: Secondary | ICD-10-CM | POA: Insufficient documentation

## 2022-09-23 DIAGNOSIS — E039 Hypothyroidism, unspecified: Secondary | ICD-10-CM | POA: Insufficient documentation

## 2022-09-23 DIAGNOSIS — R509 Fever, unspecified: Secondary | ICD-10-CM | POA: Insufficient documentation

## 2022-09-23 LAB — COMPREHENSIVE METABOLIC PANEL
ALT: 21 U/L (ref 0–44)
AST: 31 U/L (ref 15–41)
Albumin: 4.3 g/dL (ref 3.5–5.0)
Alkaline Phosphatase: 66 U/L (ref 38–126)
Anion gap: 11 (ref 5–15)
BUN: 19 mg/dL (ref 8–23)
CO2: 24 mmol/L (ref 22–32)
Calcium: 8.8 mg/dL — ABNORMAL LOW (ref 8.9–10.3)
Chloride: 100 mmol/L (ref 98–111)
Creatinine, Ser: 1.19 mg/dL (ref 0.61–1.24)
GFR, Estimated: >60 mL/min (ref >60)
Glucose, Bld: 218 mg/dL — ABNORMAL HIGH (ref 70–99)
Potassium: 3.8 mmol/L (ref 3.5–5.1)
Sodium: 135 mmol/L (ref 135–145)
Total Bilirubin: 3.4 mg/dL — ABNORMAL HIGH (ref 0.3–1.2)
Total Protein: 7.1 g/dL (ref 6.5–8.1)

## 2022-09-23 LAB — CBC WITH DIFFERENTIAL/PLATELET
Abs Immature Granulocytes: 0.07 10*3/uL (ref 0.00–0.07)
Basophils Absolute: 0 10*3/uL (ref 0.0–0.1)
Basophils Relative: 0 %
Eosinophils Absolute: 0.6 10*3/uL — ABNORMAL HIGH (ref 0.0–0.5)
Eosinophils Relative: 7 %
HCT: 44 % (ref 39.0–52.0)
Hemoglobin: 15.5 g/dL (ref 13.0–17.0)
Immature Granulocytes: 1 %
Lymphocytes Relative: 7 %
Lymphs Abs: 0.6 10*3/uL — ABNORMAL LOW (ref 0.7–4.0)
MCH: 30.2 pg (ref 26.0–34.0)
MCHC: 35.2 g/dL (ref 30.0–36.0)
MCV: 85.8 fL (ref 80.0–100.0)
Monocytes Absolute: 0.8 10*3/uL (ref 0.1–1.0)
Monocytes Relative: 9 %
Neutro Abs: 6.8 10*3/uL (ref 1.7–7.7)
Neutrophils Relative %: 76 %
Platelets: 107 10*3/uL — ABNORMAL LOW (ref 150–400)
RBC: 5.13 MIL/uL (ref 4.22–5.81)
RDW: 13.6 % (ref 11.5–15.5)
WBC: 9 10*3/uL (ref 4.0–10.5)
nRBC: 0 % (ref 0.0–0.2)

## 2022-09-23 LAB — URINALYSIS, MICROSCOPIC (REFLEX): RBC / HPF: >50 RBC/hpf (ref 0–5)

## 2022-09-23 LAB — URINALYSIS, ROUTINE W REFLEX MICROSCOPIC
Glucose, UA: NEGATIVE mg/dL
Ketones, ur: NEGATIVE mg/dL
Nitrite: NEGATIVE
Protein, ur: 100 mg/dL — AB
Specific Gravity, Urine: 1.025 (ref 1.005–1.030)
pH: 6 (ref 5.0–8.0)

## 2022-09-23 LAB — APTT: aPTT: 33 seconds (ref 24–36)

## 2022-09-23 LAB — LACTIC ACID, PLASMA
Lactic Acid, Venous: 1.6 mmol/L (ref 0.5–1.9)
Lactic Acid, Venous: 1.8 mmol/L (ref 0.5–1.9)

## 2022-09-23 LAB — SARS CORONAVIRUS 2 BY RT PCR: SARS Coronavirus 2 by RT PCR: NEGATIVE

## 2022-09-23 LAB — PROTIME-INR
INR: 1.2 (ref 0.8–1.2)
Prothrombin Time: 14.7 seconds (ref 11.4–15.2)

## 2022-09-23 MED ORDER — CIPROFLOXACIN HCL 500 MG PO TABS
500.0000 mg | ORAL_TABLET | Freq: Once | ORAL | Status: AC
  Administered 2022-09-23: 500 mg via ORAL
  Filled 2022-09-23: qty 1

## 2022-09-23 MED ORDER — IOHEXOL 300 MG/ML  SOLN
100.0000 mL | Freq: Once | INTRAMUSCULAR | Status: AC | PRN
  Administered 2022-09-23: 100 mL via INTRAVENOUS

## 2022-09-23 MED ORDER — CIPROFLOXACIN HCL 500 MG PO TABS: 500.0000 mg | ORAL_TABLET | Freq: Two times a day (BID) | ORAL | 0 refills | Status: AC

## 2022-09-23 NOTE — ED Triage Notes (Addendum)
Prostate biopsy on Friday, has had a fever since, headache. Not eating, seems out of it per family starting today. Tmax 11f. Gave tylenol 650mg  tylenol 2 hours pta. A&O x 4 during triage. Unsteady gait starting this morning.

## 2022-09-23 NOTE — Discharge Instructions (Signed)
You have been evaluated for your symptoms.  Fortunately no concerning findings were noted on today's exam.  I have discussed with on-call urologist and they recommend for you to take ciprofloxacin twice daily for the next 7 days.  Call and follow-up closely with your urologist for outpatient management as well.  Return if you have any concern.

## 2022-09-23 NOTE — ED Notes (Signed)
Patient transported to CT 

## 2022-09-23 NOTE — ED Provider Notes (Signed)
[facility] EMERGENCY DEPARTMENT Provider Note   CSN: [id number] Arrival date & time: 09/23/22  1731     History  Chief Complaint  Patient presents with   Post-op Problem    Thomas Hartman is a 71 y.o. male.  The history is provided by the patient and medical records. No language interpreter was used.     Patient is a 71 year old male presenting with a chief complaint of a "post-op problem". He reports he had a prostate biopsy done three days ago. He states he has not been feeling good ever since. He states he has been been feeling warm and sweating more than usual. He also states he has had a headache and been feeling light headed. Patient reports his symptoms come and go and are worse during the day. He tried tylenol and reports he helped his fever but did not improve his other symptoms. Patient also reported neck pain. He denies n/v/d, chest pain, palpitations, cough, numbness or tingling.   Home Medications Prior to Admission medications   Medication Sig Start Date End Date Taking? Authorizing Provider  allopurinol (ZYLOPRIM) 300 MG tablet Take 300 mg by mouth daily. 06/11/21   [provider]  amLODipine (NORVASC) 5 MG tablet Take 5 mg by mouth daily. 06/11/21   [provider]  atorvastatin (LIPITOR) 10 MG tablet Take 10 mg by mouth daily. 06/11/21   [provider]  Cholecalciferol (VITAMIN D3 PO) Take 1 tablet by mouth daily.    [provider]  Cyanocobalamin (VITAMIN B-12 PO) Take 1 tablet by mouth daily.    [provider]  diphenhydrAMINE (SOMINEX) 25 MG tablet Take 25 mg by mouth daily as needed for allergies or itching.    [provider]  FLUoxetine (PROZAC) 40 MG capsule Take 40 mg by mouth daily. 03/04/21   [provider]  fluticasone (FLONASE) 50 MCG/ACT nasal spray Place 2 sprays into both nostrils daily. 03/22/16   [provider]  ibuprofen (ADVIL) 200 MG tablet Take 400-800 mg by mouth  every 6 (six) hours as needed for headache.    [provider]  levothyroxine (SYNTHROID) 50 MCG tablet Take 50 mcg by mouth daily before breakfast. 09/25/20   [provider]  loratadine (CLARITIN) 10 MG tablet Take 10 mg by mouth daily.    [provider]  Melatonin 10 MG TABS Take 10 mg by mouth at bedtime.    [provider]  metoprolol succinate (TOPROL-XL) 50 MG 24 hr tablet Take 25 mg by mouth daily. 05/16/21   [provider]  Multiple Vitamin (MULTIVITAMIN) tablet Take 1 tablet by mouth daily.    [provider]  naproxen sodium (ALEVE) 220 MG tablet Take 220 mg by mouth daily as needed (pain).    [provider]  omeprazole (PRILOSEC) 20 MG capsule Take 20 mg by mouth daily. 03/06/21   [provider]      Allergies    Zolpidem    Review of Systems   Review of Systems  All other systems reviewed and are negative.   Physical Exam Updated Vital Signs BP (!) 121/98 (BP Location: Right Arm)   Pulse 93   Temp 98.6 F (37 C) (Oral)   Resp (!) 24   Ht 5\' 10"  (1.778 m)   Wt 83.9 kg   SpO2 95%   BMI 26.54 kg/m  Physical Exam Vitals and nursing note reviewed.  Constitutional:      General: He is not  in acute distress.    Appearance: He is well-developed.  HENT:     Head: Atraumatic.     Comments: Ears: Cerumen impaction noted to bilateral ear canals, unable to visualize TMs.    Mouth/Throat:     Mouth: Mucous membranes are moist.  Eyes:     Conjunctiva/sclera: Conjunctivae normal.  Cardiovascular:     Rate and Rhythm: Normal rate and regular rhythm.     Pulses: Normal pulses.     Heart sounds: Normal heart sounds.  Pulmonary:     Effort: Pulmonary effort is normal.     Breath sounds: Normal breath sounds. No wheezing, rhonchi or rales.  Abdominal:     Palpations: Abdomen is soft.     Tenderness: There is no abdominal tenderness.  Musculoskeletal:     Cervical back: Normal range of motion and neck  supple. No rigidity or tenderness.  Skin:    Findings: No rash.  Neurological:     Mental Status: He is alert. Mental status is at baseline.  Psychiatric:        Mood and Affect: Mood normal.     ED Results / Procedures / Treatments   Labs (all labs ordered are listed, but only abnormal results are displayed) Labs Reviewed  CBC WITH DIFFERENTIAL/PLATELET - Abnormal; Notable for the following components:      Result Value   Platelets 107 (*)    Lymphs Abs 0.6 (*)    Eosinophils Absolute 0.6 (*)    All other components within normal limits  COMPREHENSIVE METABOLIC PANEL - Abnormal; Notable for the following components:   Glucose, Bld 218 (*)    Calcium 8.8 (*)    Total Bilirubin 3.4 (*)    All other components within normal limits  URINALYSIS, ROUTINE W REFLEX MICROSCOPIC - Abnormal; Notable for the following components:   Color, Urine AMBER (*)    APPearance TURBID (*)    Hgb urine dipstick LARGE (*)    Bilirubin Urine SMALL (*)    Protein, ur 100 (*)    Leukocytes,Ua TRACE (*)    All other components within normal limits  URINALYSIS, MICROSCOPIC (REFLEX) - Abnormal; Notable for the following components:   Bacteria, UA RARE (*)    All other components within normal limits  SARS CORONAVIRUS 2 BY RT PCR  CULTURE, BLOOD (ROUTINE X 2)  CULTURE, BLOOD (ROUTINE X 2)  URINE CULTURE  LACTIC ACID, PLASMA  LACTIC ACID, PLASMA  PROTIME-INR  APTT    EKG EKG Interpretation  Date/Time:  Monday September 23 2022 17:47:30 EDT Ventricular Rate:  101 PR Interval:  172 QRS Duration: 86 QT Interval:  324 QTC Calculation: 420 R Axis:   -22 Text Interpretation: Sinus tachycardia diffuse nonspecific ST changes No old tracing to compare Confirmed by Pricilla Loveless [phone]) on 09/23/2022 6:53:02 PM  Radiology CT ABDOMEN PELVIS W CONTRAST  Result Date: 09/23/2022 CLINICAL DATA:  Abdominal pain and fever.  Recent prostate biopsy. EXAM: CT ABDOMEN AND PELVIS WITH CONTRAST TECHNIQUE:  Multidetector CT imaging of the abdomen and pelvis was performed using the standard protocol following bolus administration of intravenous contrast. RADIATION DOSE REDUCTION: This exam was performed according to the departmental dose-optimization program which includes automated exposure control, adjustment of the mA and/or kV according to patient size and/or use of iterative reconstruction technique. CONTRAST:  OMNIPAQUE IOHEXOL 300 MG/ML  SOLN COMPARISON:  10/31/2016 FINDINGS: Lower chest: Calcified granuloma noted in the lingula. Chronic interstitial reticulation is identified bilaterally. Stable nodule within the posteromedial  right lower lobe measuring 4 mm compatible with a benign abnormality. Hepatobiliary: Right lobe of liver hemangioma is again noted and appears unchanged measuring 3.6 cm, image 19/2. No suspicious liver abnormality. Gallbladder appears normal. No bile duct dilatation. Pancreas: Unremarkable. No pancreatic ductal dilatation or surrounding inflammatory changes. Spleen: The spleen measures 15.1 cm in cranial caudal dimension. Unchanged compared with the previous exam. No focal splenic lesion. Adrenals/Urinary Tract: Normal adrenal glands. Benign Bosniak class 1 cyst arises off the inferior pole of the right kidney measuring 1.8 cm. Additional low-attenuation kidney lesions are technically too small to characterize compatible with Bosniak class 2 lesions. No follow-up imaging recommended. No hydronephrosis or hydroureter. Urinary bladder is unremarkable. Stomach/Bowel: Moderate hiatal hernia. The appendix is visualized and appears normal. No bowel wall thickening, inflammation, or distension. Vascular/Lymphatic: No significant vascular findings are present. No enlarged abdominal or pelvic lymph nodes. Reproductive: Subjective increased vascularity to the prostate gland noted. The gland appears heterogeneous with a focal area of low attenuation within the posterior base measuring 1.4 x  1.2 cm, image 80/2. Nonspecific in the setting of recent biopsy. Other: There is no free fluid or fluid collections within the remaining portions of the abdomen or pelvis. Musculoskeletal: No acute or significant osseous findings. Curvature of the lumbar spine is convex towards the left. Degenerative disc disease noted within the lumbar spine. IMPRESSION: 1. Subjective increased vascularity to the prostate gland with a focal area of low attenuation within the posterior base measuring 1.4 x 1.2 cm. Nonspecific in the setting of recent biopsy. 2. Unchanged appearance of right lobe of liver hemangioma. 3. Splenomegaly.  Unchanged from previous exam. 4. Hiatal hernia. Electronically Signed   By: Signa Kell M.D.   On: 09/23/2022 20:39   DG Chest 2 View  Result Date: 09/23/2022 CLINICAL DATA:  Questionable sepsis. EXAM: CHEST - 2 VIEW COMPARISON:  None Available. FINDINGS: The heart size and mediastinal contours are within normal limits. Both lungs are clear. The visualized skeletal structures are unremarkable. IMPRESSION: No active cardiopulmonary disease. Electronically Signed   By: Darliss Cheney M.D.   On: 09/23/2022 18:13    Procedures Procedures    Medications Ordered in ED Medications  iohexol (OMNIPAQUE) 300 MG/ML solution 100 mL (100 mLs Intravenous Contrast Given 09/23/22 2012)  ciprofloxacin (CIPRO) tablet 500 mg (500 mg Oral Given 09/23/22 2130)    ED Course/ Medical Decision Making/ A&P                           Medical Decision Making Amount and/or Complexity of Data Reviewed Labs: ordered. Radiology: ordered. ECG/medicine tests: ordered.   BP (!) 121/98 (BP Location: Right Arm)   Pulse 93   Temp 98.6 F (37 C) (Oral)   Resp (!) 24   Ht 5\' 10"  (1.778 m)   Wt 83.9 kg   SpO2 95%   BMI 26.54 kg/m   6:38 PM This is a 71 year old male significant history of hypertension, GERD, depression, hypothyroidism, recently noted to have elevated PSA and underwent transrectal  ultrasound and prostate biopsy on 09/20/2022 presenting today with complaints of fever.  Patient mentions since having the procedure he has had fever as high as 101.3.  He also endorses body aches, headache, neck discomfort, congestion.  He noticed blood in his urine.  He does not complain of any sore throat, productive cough, shortness of breath, chest pain, abdominal pain, dysuria, pain with bowel movement or blood per rectum.  He did  take Tylenol at home for his symptoms.  He reach out to his doctor who recommended coming to the ER for further assessment.  On exam, patient is well-appearing appears to be in no acute discomfort.  He is mentating appropriately he does not have any nuchal rigidity concerning for meningitis.  Heart lung sounds normal.  Abdomen is soft nontender.  Patient able to move all 4 extremities without difficulty.  Patient is afebrile, no hypoxia and blood pressure is normal.  9:09 PM Labs and imaging obtained independently viewed interpreted by me and I agree with radiologist interpretation.  COVID test is negative, lactic acid is normal, urinalysis shows large hemoglobin and urine dipsticks without signs of urinary tract infection, normal WBC, normal H&H and electrolyte panels are mostly reassuring.  Elevated CBG of 218 likely in the setting of stress and being a diabetic.  Abdominal pelvis CT scan shows subjective increased vascularity to the prostate gland with a focal area of low-attenuation within the posterior base measuring 1.4 x 1.2 cm.  This is nonspecific in the setting of recent biopsy.  I appreciate consultation from on-call urologist who recommend starting patient on Cipro 500 mg twice daily X 7 days and to follow-up outpatient with his urologist for further care.  I discussed this with attending Dr. Criss AlvineGoldston.    This patient presents to the ED for concern of fever, this involves an extensive number of treatment options, and is a complaint that carries with it a high  risk of complications and morbidity.  The differential diagnosis includes covid, flu, meningitis, colitis, prostatitis, uti, diverticulitis  Co morbidities that complicate the patient evaluation HTN  GERD Additional history obtained:  Additional history obtained from daughter External records from outside source obtained and reviewed including EMR including prior labs and imaging  Lab Tests:  I Ordered, and personally interpreted labs.  The pertinent results include:  as above  Imaging Studies ordered:  I ordered imaging studies including abd/pelvis CT I independently visualized and interpreted imaging which showed nonspecific changes at the prostate I agree with the radiologist interpretation  Cardiac Monitoring:  The patient was maintained on a cardiac monitor.  I personally viewed and interpreted the cardiac monitored which showed an underlying rhythm of: NSR  Medicines ordered and prescription drug management:  I ordered medication including cipro  for fever Reevaluation of the patient after these medicines showed that the patient stayed the same I have reviewed the patients home medicines and have made adjustments as needed  Test Considered: as above  Critical Interventions: abx  Consultations Obtained:  I requested consultation with the urologist,  and discussed lab and imaging findings as well as pertinent plan - they recommend: abx and outpt f/u   Problem List / ED Course: fever  Recent prostate biopsy  Reevaluation:  After the interventions noted above, I reevaluated the patient and found that they have :improved  Social Determinants of Health: none   Dispostion:  After consideration of the diagnostic results and the patients response to treatment, I feel that the patent would benefit from outpt f/u.         Final Clinical Impression(s) / ED Diagnoses Final diagnoses:  Fever, unknown origin    Rx / DC Orders ED Discharge Orders           Ordered    ciprofloxacin (CIPRO) 500 MG tablet  Every 12 hours        09/23/22 2113  Domenic Moras, PA-C 09/23/22 2149    Sherwood Gambler, MD 09/24/22 [phone]

## 2022-09-25 LAB — URINE CULTURE: Culture: 30000 — AB

## 2022-09-26 ENCOUNTER — Telehealth (HOSPITAL_BASED_OUTPATIENT_CLINIC_OR_DEPARTMENT_OTHER): Payer: Self-pay | Admitting: *Deleted

## 2022-09-26 NOTE — Progress Notes (Signed)
ED Antimicrobial Stewardship Positive Culture Follow Up   Thomas Hartman is an 71 y.o. male who presented to [facility] on 09/23/2022 with a chief complaint of  Chief Complaint  Patient presents with   Post-op Problem    Recent Results (from the past 720 hour(s))  Urine Culture     Status: Abnormal   Collection Time: 09/23/22  6:20 PM   Specimen: Urine, Random  Result Value Ref Range Status   Specimen Description   Final    URINE, RANDOM Performed at [facility], [address] [place], [place] [address]    Special Requests   Final    NONE Performed at [facility], [address] [place], [place] [address]    Culture 30,000 COLONIES/mL ESCHERICHIA COLI (A)  Final   Report Status 09/25/2022 FINAL  Final   Organism ID, Bacteria ESCHERICHIA COLI (A)  Final      Susceptibility   Escherichia coli - MIC*    AMPICILLIN 4 SENSITIVE Sensitive     CEFAZOLIN <=4 SENSITIVE Sensitive     CEFEPIME <=0.12 SENSITIVE Sensitive     CEFTRIAXONE <=0.25 SENSITIVE Sensitive     CIPROFLOXACIN >=4 RESISTANT Resistant     GENTAMICIN <=1 SENSITIVE Sensitive     IMIPENEM <=0.25 SENSITIVE Sensitive     NITROFURANTOIN <=16 SENSITIVE Sensitive     TRIMETH/SULFA >=320 RESISTANT Resistant     AMPICILLIN/SULBACTAM <=2 SENSITIVE Sensitive     PIP/TAZO <=4 SENSITIVE Sensitive     * 30,000 COLONIES/mL ESCHERICHIA COLI  Blood Culture (routine x 2)     Status: None (Preliminary result)   Collection Time: 09/23/22  6:24 PM   Specimen: Right Antecubital; Blood  Result Value Ref Range Status   Specimen Description   Final    RIGHT ANTECUBITAL BLOOD Performed at [facility], [address], [place], [place] [address]    Special Requests   Final    BOTTLES DRAWN AEROBIC AND ANAEROBIC Blood Culture results may not be optimal due to an excessive volume of blood received in culture bottles Performed at [facility], [address] [place], [place] [address]    Culture    Final    NO GROWTH 3 DAYS Performed at [facility], [address] [place], [place] [address]    Report Status PENDING  Incomplete  Blood Culture (routine x 2)     Status: None (Preliminary result)   Collection Time: 09/23/22  6:27 PM   Specimen: BLOOD LEFT FOREARM  Result Value Ref Range Status   Specimen Description   Final    BLOOD LEFT FOREARM Performed at [facility], [address] [place], [place] [address]    Special Requests   Final    BOTTLES DRAWN AEROBIC AND ANAEROBIC Blood Culture adequate volume Performed at [facility], [address] [place], [place] [address]    Culture   Final    NO GROWTH 3 DAYS Performed at [facility], [address], [place], [place] [address]    Report Status PENDING  Incomplete  SARS Coronavirus 2 by RT PCR (hospital order, performed in [facility] hospital lab) *cepheid single result test* Anterior Nasal Swab     Status: None   Collection Time: 09/23/22  7:31 PM   Specimen: Anterior Nasal Swab  Result Value Ref Range Status   SARS Coronavirus 2 by RT PCR NEGATIVE NEGATIVE Final  Comment: (NOTE) SARS-CoV-2 target nucleic acids are NOT DETECTED.  The SARS-CoV-2 RNA is generally detectable in upper and lower respiratory specimens during the acute phase of infection. The lowest concentration of SARS-CoV-2 viral copies this assay can detect is 250 copies / mL. A negative result does not preclude SARS-CoV-2 infection and should not be used as the sole basis for treatment or other patient management decisions.  A negative result may occur with improper specimen collection / handling, submission of specimen other than nasopharyngeal swab, presence of viral mutation(s) within the areas targeted by this assay, and inadequate number of viral copies (<250 copies / mL). A negative result must be combined with clinical observations, patient history, and epidemiological information.  Fact Sheet for  Patients:   https://www.patel.info/  Fact Sheet for Healthcare Providers: https://hall.com/  This test is not yet approved or  cleared by the [place] FDA and has been authorized for detection and/or diagnosis of SARS-CoV-2 by FDA under an Emergency Use Authorization (EUA).  This EUA will remain in effect (meaning this test can be used) for the duration of the COVID-19 declaration under Section 564(b)(1) of the Act, 21 U.S.C. section 360bbb-3(b)(1), unless the authorization is terminated or revoked sooner.  Performed at [facility], [address] [place], [place] [address]     [x]  Treated with Cipro, organism resistant to prescribed antimicrobial []  Patient discharged originally without antimicrobial agent and treatment is now indicated  New antibiotic prescription: Keflex  ED Provider: Alecia Lemming, PA-C   Thomas Hartman 09/26/2022, 7:23 AM Clinical Pharmacist Monday - Friday phone -  [phone] Saturday - Sunday phone - [phone]

## 2022-09-26 NOTE — Telephone Encounter (Signed)
Post ED Visit - Positive Culture Follow-up: Unsuccessful Patient Follow-up  Culture assessed and recommendations reviewed by:  []  Elenor Quinones, Pharm.D. []  Heide Guile, Pharm.D., BCPS AQ-ID []  Parks Neptune, Pharm.D., BCPS []  Alycia Rossetti, Pharm.D., BCPS []  [place], Pharm.D., BCPS, AAHIVP []  Legrand Como, Pharm.D., BCPS, AAHIVP []  Wynell Balloon, PharmD []  Vincenza Hews, PharmD, BCPS  Positive urine culture  []  Patient discharged without antimicrobial prescription and treatment is now indicated [x]  Organism is resistant to prescribed ED discharge antimicrobial []  Patient with positive blood cultures  Plan:  D/C Cipro.  Start Keflex 500mg  PO QID x 7 days, Alecia Lemming, [place]   Unable to contact patient after 3 attempts, letter will be sent to address on file  Ardeen Fillers 09/26/2022, 12:33 PM

## 2022-09-28 LAB — CULTURE, BLOOD (ROUTINE X 2)
Culture: NO GROWTH
Culture: NO GROWTH
Special Requests: ADEQUATE

## 2023-05-03 IMAGING — DX DG FINGER RING 2+V*L*
3 series · 3 of 3 positions shown · non-contrast
Comparison: None.

CLINICAL DATA: Laceration, digit was caught in moving machinery.

EXAM:
LEFT RING FINGER 2+V

[finger ap]
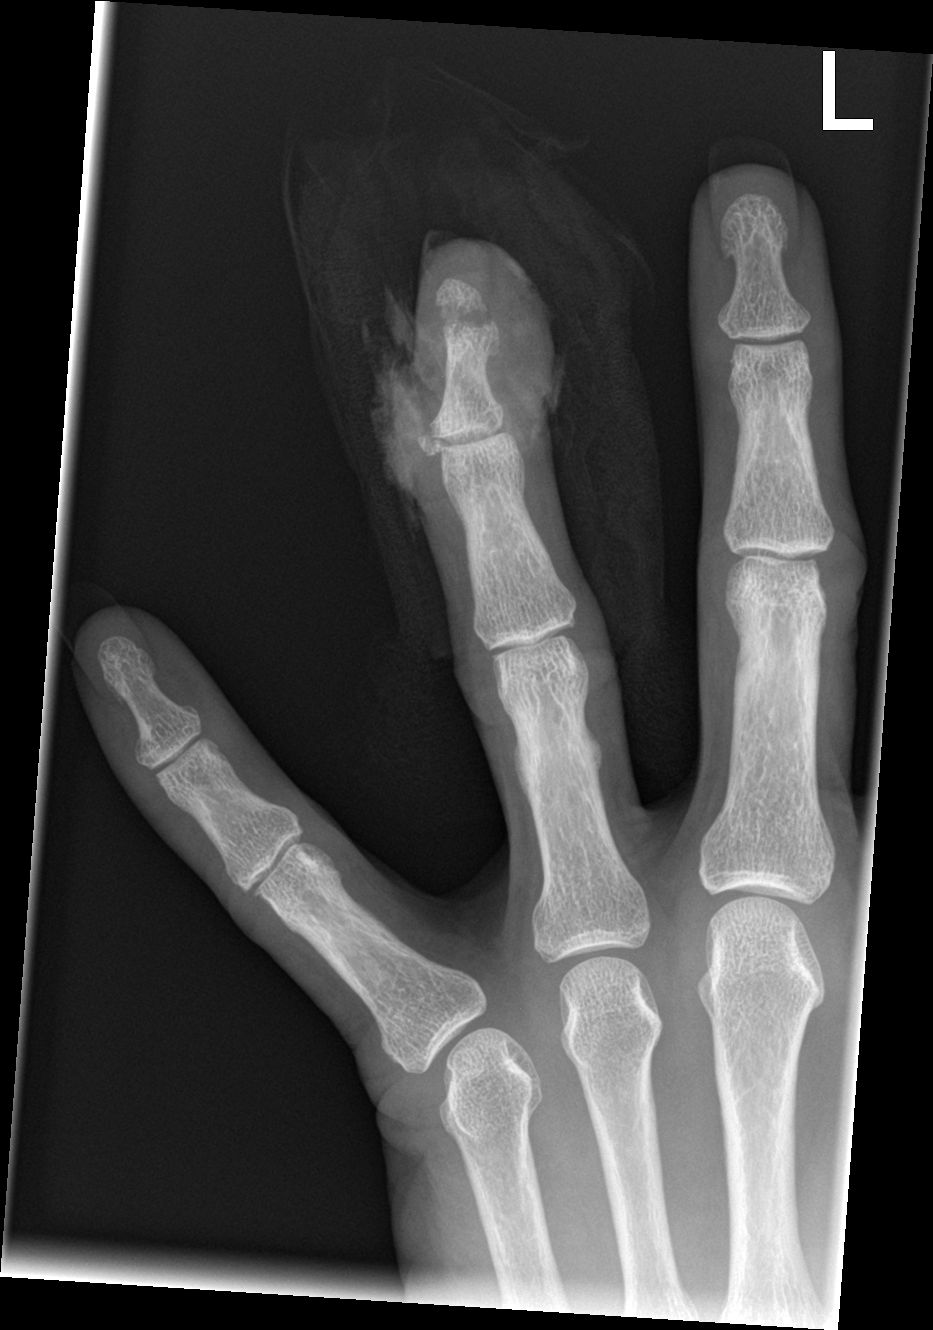

[finger obl]
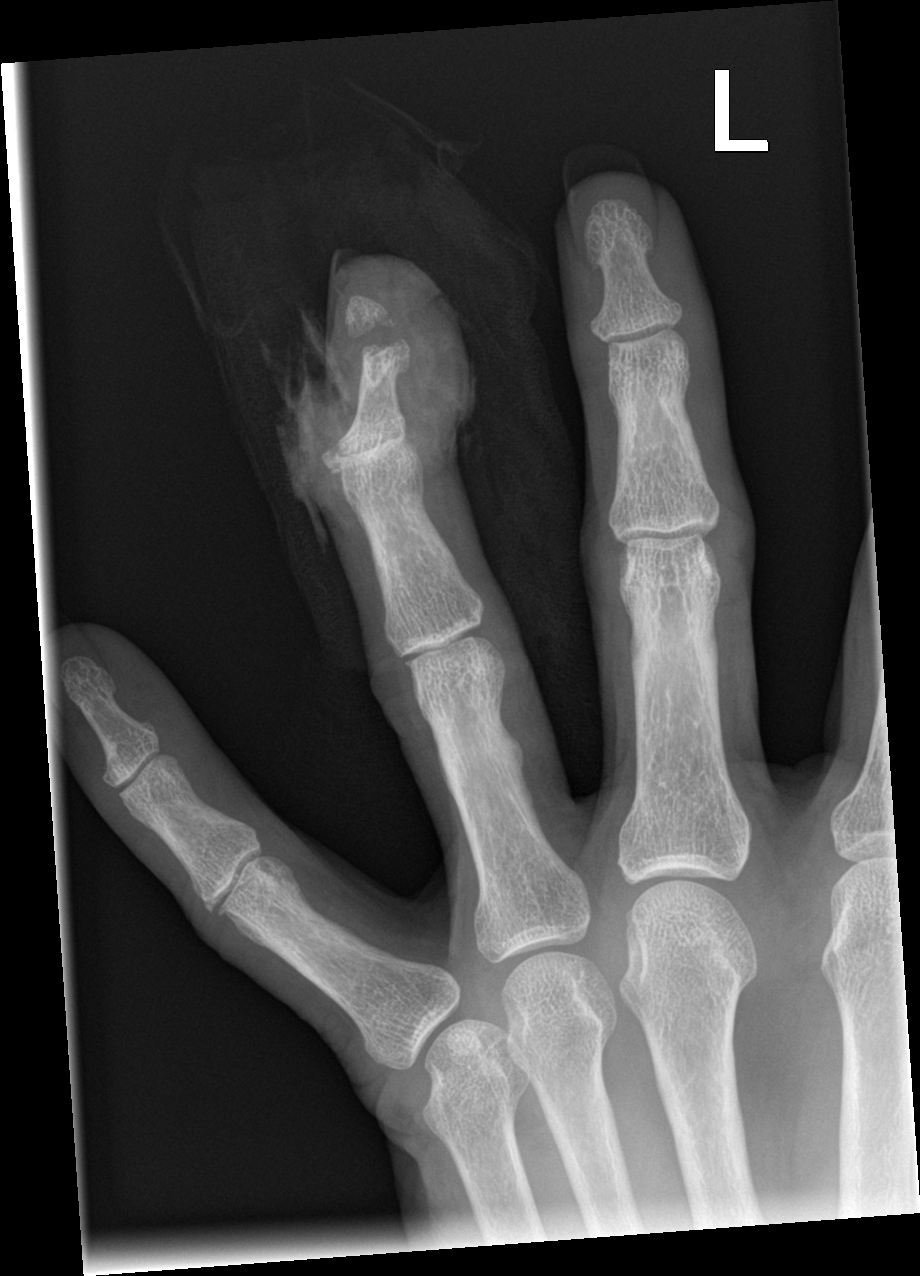

[finger lat]
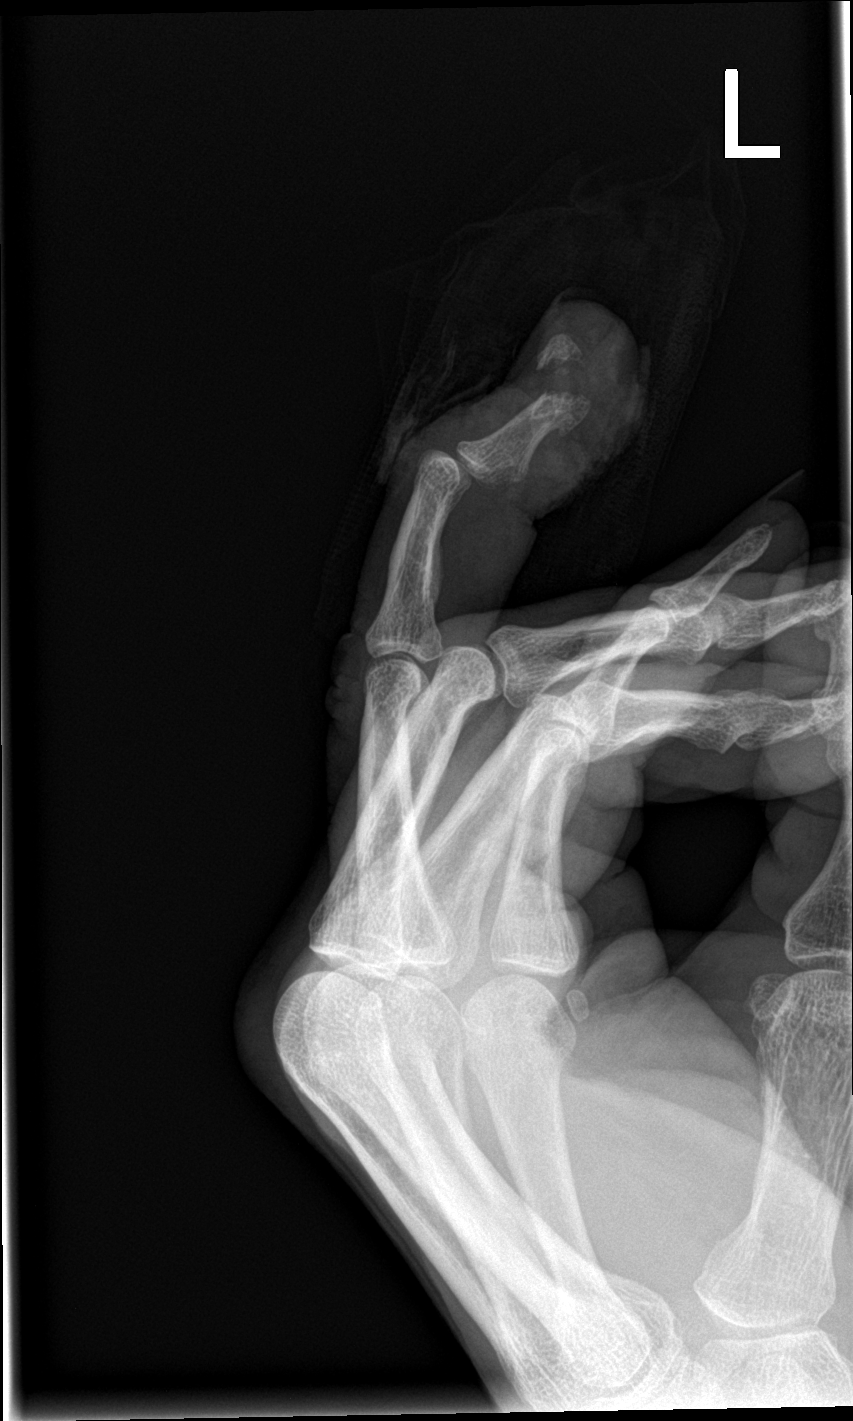

[3 of 3 positions shown; findings below may reference images not displayed]

FINDINGS: Displaced fracture through the distal tuft. There is also a fracture
involving the ulnar aspect at the base of the distal phalanx at the
distal interphalangeal joint articular surface. Skin laceration and
irregularity. Overlying dressing in place without definite
radiopaque foreign body.
IMPRESSION: 1. Displaced distal phalanx fracture. Additionally there is a
fracture of the ulnar aspect at the base of the distal phalanx at
the distal interphalangeal joint articular surface.
2. Overlying soft tissue irregularity and laceration.
